# Patient Record
Sex: Female | Born: 1950
Health system: Southern US, Community
[De-identification: ages and names within clinical notes are randomized; demographics above are authoritative.]

---

## 2013-08-23 ENCOUNTER — Emergency Department (HOSPITAL_BASED_OUTPATIENT_CLINIC_OR_DEPARTMENT_OTHER): Payer: Worker's Compensation

## 2013-08-23 ENCOUNTER — Emergency Department (HOSPITAL_BASED_OUTPATIENT_CLINIC_OR_DEPARTMENT_OTHER)
Admission: EM | Admit: 2013-08-23 | Discharge: 2013-08-23 | Disposition: A | Discharge status: Home / Self Care | Payer: Worker's Compensation | Attending: Emergency Medicine | Admitting: Emergency Medicine

## 2013-08-23 ENCOUNTER — Encounter (HOSPITAL_BASED_OUTPATIENT_CLINIC_OR_DEPARTMENT_OTHER): Payer: Self-pay | Admitting: Emergency Medicine

## 2013-08-23 DIAGNOSIS — Y9289 Other specified places as the place of occurrence of the external cause: Secondary | ICD-10-CM | POA: Insufficient documentation

## 2013-08-23 DIAGNOSIS — S52502A Unspecified fracture of the lower end of left radius, initial encounter for closed fracture: Secondary | ICD-10-CM

## 2013-08-23 DIAGNOSIS — Y9389 Activity, other specified: Secondary | ICD-10-CM | POA: Insufficient documentation

## 2013-08-23 DIAGNOSIS — W230XXA Caught, crushed, jammed, or pinched between moving objects, initial encounter: Secondary | ICD-10-CM | POA: Insufficient documentation

## 2013-08-23 DIAGNOSIS — S52599A Other fractures of lower end of unspecified radius, initial encounter for closed fracture: Secondary | ICD-10-CM | POA: Insufficient documentation

## 2013-08-23 MED ORDER — OXYCODONE-ACETAMINOPHEN 5-325 MG PO TABS: 1.0000 | ORAL_TABLET | ORAL | Status: DC | PRN

## 2013-08-23 NOTE — ED Notes (Signed)
Patient transported to X-ray 

## 2013-08-23 NOTE — ED Notes (Signed)
Pt has injury to left wrist that occurred at work, wrist was stuck between two poles. CMS intact.

## 2013-08-23 NOTE — Discharge Instructions (Signed)
Wrist Fracture °A wrist fracture is a break in one of the bones of the wrist. Your wrist is made up of several small bones at the palm of your hand (carpal bones) and the two bones that make up your forearm (radius and ulna). The bones come together to form multiple large and small joints. The shape and design of these joints allow your wrist to bend and straighten, move side-to-side, and rotate, as in twisting your palm up or down. °CAUSES  °A fracture may occur in any of the bones in your wrist when enough force is applied to the wrist, such as when falling down onto an outstretched hand. Severe injuries may occur from a more forceful injury. °SYMPTOMS °Symptoms of wrist fractures include tenderness, bruising, and swelling. Additionally, the wrist may hang in an odd position or may be misshaped. °DIAGNOSIS °To diagnose a wrist fracture, your caregiver will physically examine your wrist. Your caregiver may also request an X-ray exam of your wrist. °TREATMENT °Treatment depends on many factors, including the nature and location of the fracture, your age, and your activity level. Treatment for wrist fracture can be nonsurgical or surgical. °For nonsurgical treatment, a plaster cast or splint may be applied to your wrist if the bone is in a good position (aligned). The cast will stay on for about 6 weeks. If the alignment of your bone is not good, it may be necessary to realign (reduce) it. After the bone is reduced, a splint usually is placed on your wrist to allow for a small amount of normal swelling. After about 1 week, the splint is removed and a cast is added. The cast is removed 2 or 3 weeks later, after the swelling goes down, causing the cast to loosen. Another cast is applied. This cast is removed after about another 2 or 3 weeks, for a total of 4 to 6 weeks of immobilization. °Sometimes the position of the bone is so far out of place that surgery is required to apply a device to hold it together as it  heals. If the bone cannot be reduced without cutting the skin around the bone (closed reduction), a cut (incision) is made to allow direct access to the bone to reduce it (open reduction). Depending on the fracture, there are a number of options for holding the bone in place while it heals, including a cast, metal pins, a plate and screws, and a device called an external fixator. With an external fixator, most of the hardware remains outside of the body. °HOME CARE INSTRUCTIONS °· To lessen swelling, keep your injured wrist elevated and move your fingers as much as possible. °· Apply ice to your wrist for the first 1 to 2 days after you have been treated or as directed by your caregiver. Applying ice helps to reduce inflammation and pain. °¨ Put ice in a plastic bag. °¨ Place a towel between your skin and the bag. °¨ Leave the ice on for 15 to 20 minutes at a time, every 2 hours while you are awake. °· Do not put pressure on any part of your cast or splint. It may break. °· Use a plastic bag to protect your cast or splint from water while bathing or showering. Do not lower your cast or splint into water. °· Only take over-the-counter or prescription medicines for pain as directed by your caregiver. °SEEK IMMEDIATE MEDICAL CARE IF:  °· Your cast or splint gets damaged or breaks. °· You have continued severe pain   or more swelling than you did before the cast was put on. °· Your skin or fingernails below the injury turn blue or gray or feel cold or numb. °· You develop decreased feeling in your fingers. °MAKE SURE YOU: °· Understand these instructions. °· Will watch your condition. °· Will get help right away if you are not doing well or get worse. °Document Released: 12/03/2004 Document Revised: 05/18/2011 Document Reviewed: 03/13/2011 °[employer]® Patient Information ©2015 [employer]. This information is not intended to replace advice given to you by your health care provider. Make sure you discuss any questions you  have with your health care provider. ° °

## 2013-08-23 NOTE — ED Provider Notes (Signed)
CSN: [id number]     Arrival date & time 08/23/13  1128 History   First MD Initiated Contact with Patient 08/23/13 1143     Chief Complaint  Patient presents with  . Wrist Injury     (Consider location/radiation/quality/duration/timing/severity/associated sxs/prior Treatment) HPI Comments: Patient presents to ER for evaluation of left wrist injury. Patient was moving an object and her wrist was caught between a pole and the object. Patient complains of pain at the wrist which worsens with movement.  Patient is a 63 y.o. female presenting with wrist injury.  Wrist Injury   History reviewed. No pertinent past medical history. History reviewed. No pertinent past surgical history. No family history on file. History  Substance Use Topics  . Smoking status: Never Smoker   . Smokeless tobacco: Not on file  . Alcohol Use: No   OB History   Grav Para Term Preterm Abortions TAB SAB Ect Mult Living                 Review of Systems  Musculoskeletal: Positive for arthralgias.      Allergies  Codeine  Home Medications   Prior to Admission medications   Medication Sig Start Date End Date Taking? Authorizing Provider  ALPRAZolam Prudy Feeler(XANAX) 0.5 MG tablet Take 0.5 mg by mouth at bedtime as needed for anxiety.   Yes Historical Provider, MD   BP 158/78  Pulse 99  Temp(Src) 98.4 F (36.9 C) (Oral)  Ht 5\' 1"  (1.549 m)  Wt 145 lb (65.772 kg)  BMI 27.41 kg/m2  SpO2 97% Physical Exam  Constitutional: She is oriented to person, place, and time. She appears well-developed and well-nourished.  Cardiovascular: Normal rate and regular rhythm.   Pulmonary/Chest: Effort normal and breath sounds normal.  Musculoskeletal:       Left wrist: She exhibits decreased range of motion, tenderness and swelling.  Neurological: She is alert and oriented to person, place, and time. She exhibits normal muscle tone.  Skin:       ED Course  Procedures (including critical care time) Labs Review Labs  Reviewed - No data to display  Imaging Review Dg Wrist Complete Left  08/23/2013   CLINICAL DATA:  Pain post trauma  EXAM: LEFT WRIST - COMPLETE 3+ VIEW  COMPARISON:  None.  FINDINGS: Frontal, oblique, lateral, and ulnar deviation scaphoid images were obtained. There is a transversely oriented fracture through the distal radial diaphysis. There is a second subtle lucency in the distal radial metaphysis - diaphysis junction. No other fractures. No dislocation. Joint spaces appear intact. There is a small focus of calcification adjacent to the ulnar styloid which may represent residua of old trauma or possibly a small area of unfused apophysis.  IMPRESSION: Nondisplaced fractures of the distal radius. No dislocation. Small focus of calcification adjacent to the ulnar styloid consistent with either residua of old trauma or nonfusion of small apophysis.   Electronically Signed   By: Bretta BangWilliam  Woodruff M.D.   On: 08/23/2013 12:11     EKG Interpretation None      MDM   Final diagnoses:  None  Distal Radius Fracture   Presents to the ER for evaluation of isolated wrist injury. Patient had a crushing type of motion to the distal wrist region. X-ray shows nondisplaced linear transverse fracture. She was placed in a sugar tong splint and sling, will be provided analgesia.  [place] in [place] that she will call for followup. This was a workers comp incident, so  she was informed that she needs to talk to her job and see if they have a specialist that she needs to see. As a final resort, was also given contact information for Doctor workman, on-call for hand surgery, in the event that she can find no one else to followup with.    Gilda Creasehristopher J. Pollina, MD 08/23/13 [phone]

## 2015-05-01 ENCOUNTER — Encounter (HOSPITAL_BASED_OUTPATIENT_CLINIC_OR_DEPARTMENT_OTHER): Payer: Self-pay | Admitting: *Deleted

## 2015-05-01 ENCOUNTER — Emergency Department (HOSPITAL_BASED_OUTPATIENT_CLINIC_OR_DEPARTMENT_OTHER): Payer: 59

## 2015-05-01 ENCOUNTER — Emergency Department (HOSPITAL_BASED_OUTPATIENT_CLINIC_OR_DEPARTMENT_OTHER)
Admission: EM | Admit: 2015-05-01 | Discharge: 2015-05-01 | Disposition: A | Discharge status: Home / Self Care | Payer: 59 | Attending: Emergency Medicine | Admitting: Emergency Medicine

## 2015-05-01 DIAGNOSIS — S52502A Unspecified fracture of the lower end of left radius, initial encounter for closed fracture: Secondary | ICD-10-CM | POA: Insufficient documentation

## 2015-05-01 DIAGNOSIS — W010XXA Fall on same level from slipping, tripping and stumbling without subsequent striking against object, initial encounter: Secondary | ICD-10-CM | POA: Insufficient documentation

## 2015-05-01 DIAGNOSIS — S6992XA Unspecified injury of left wrist, hand and finger(s), initial encounter: Secondary | ICD-10-CM | POA: Diagnosis present

## 2015-05-01 DIAGNOSIS — Y9389 Activity, other specified: Secondary | ICD-10-CM | POA: Insufficient documentation

## 2015-05-01 DIAGNOSIS — Y92002 Bathroom of unspecified non-institutional (private) residence single-family (private) house as the place of occurrence of the external cause: Secondary | ICD-10-CM | POA: Diagnosis not present

## 2015-05-01 DIAGNOSIS — Y998 Other external cause status: Secondary | ICD-10-CM | POA: Insufficient documentation

## 2015-05-01 MED ORDER — FENTANYL CITRATE (PF) 100 MCG/2ML IJ SOLN
100.0000 ug | Freq: Once | INTRAMUSCULAR | Status: AC
  Administered 2015-05-01: 100 ug via INTRAVENOUS
  Filled 2015-05-01: qty 2

## 2015-05-01 MED ORDER — OXYCODONE-ACETAMINOPHEN 5-325 MG PO TABS: 1.0000 | ORAL_TABLET | ORAL | Status: AC | PRN

## 2015-05-01 MED ORDER — OXYCODONE-ACETAMINOPHEN 5-325 MG PO TABS: 2.0000 | ORAL_TABLET | Freq: Once | ORAL | Status: DC

## 2015-05-01 MED ORDER — FENTANYL CITRATE (PF) 100 MCG/2ML IJ SOLN
50.0000 ug | Freq: Once | INTRAMUSCULAR | Status: AC
  Administered 2015-05-01: 50 ug via INTRAVENOUS
  Filled 2015-05-01: qty 2

## 2015-05-01 MED FILL — OXYCODONE/APAP 5-325: 5-325 | 2 days supply | Qty: 20 | Fill #0

## 2015-05-01 NOTE — ED Provider Notes (Signed)
CSN: [id number]     Arrival date & time 05/01/15  0865 History   First MD Initiated Contact with Patient 05/01/15 0801     No chief complaint on file.    (Consider location/radiation/quality/duration/timing/severity/associated sxs/prior Treatment) HPI  65 year old female presents with a left wrist injury after a fall. She tripped and fell and landed on a dresser. Feels some tingling in her fingers but is able to move her fingers. Broke the same wrist but did not require surgery 2 years ago. Did not hit her head or lose consciousness. No other injuries.  History reviewed. No pertinent past medical history. History reviewed. No pertinent past surgical history. No family history on file. Social History  Substance Use Topics  . Smoking status: Never Smoker   . Smokeless tobacco: None  . Alcohol Use: No   OB History    No data available     Review of Systems  Musculoskeletal: Positive for joint swelling and arthralgias.  Skin: Negative for wound.  Neurological: Positive for numbness. Negative for weakness.      Allergies  Codeine  Home Medications   Prior to Admission medications   Medication Sig Start Date End Date Taking? Authorizing Provider  ALPRAZolam Prudy Feeler) 0.5 MG tablet Take 0.5 mg by mouth at bedtime as needed for anxiety.   Yes Historical Provider, MD   BP 154/91 mmHg  Pulse 73  Temp(Src) 98.4 F (36.9 C) (Oral)  Resp 18  Ht  (1.549 m)  Wt 145 lb (65.772 kg)  BMI 27.41 kg/m2  SpO2 100% Physical Exam  Constitutional: She is oriented to person, place, and time. She appears well-developed and well-nourished.  HENT:  Head: Normocephalic and atraumatic.  Right Ear: External ear normal.  Left Ear: External ear normal.  Nose: Nose normal.  Eyes: Right eye exhibits no discharge. Left eye exhibits no discharge.  Cardiovascular: Normal rate and regular rhythm.   Pulses:      Radial pulses are 2+ on the left side.  Pulmonary/Chest: Effort normal.   Abdominal: She exhibits no distension.  Musculoskeletal:       Left elbow: She exhibits normal range of motion. No tenderness found.       Left wrist: She exhibits decreased range of motion, tenderness, bony tenderness, swelling and deformity (dorsally). She exhibits no laceration.       Left forearm: She exhibits no tenderness.  Normal capillary refill in fingers. Able to move fingers and has normal sensation  Neurological: She is alert and oriented to person, place, and time.  Skin: Skin is warm and dry.  Nursing note and vitals reviewed.   ED Course  Procedures (including critical care time) Labs Review Labs Reviewed - No data to display  Imaging Review Dg Wrist Complete Left  05/01/2015  CLINICAL DATA:  Pain following fall EXAM: LEFT WRIST - COMPLETE 3+ VIEW COMPARISON:  August 23, 2013. FINDINGS: Frontal, oblique, and lateral views were obtained. There is a comminuted fracture of the distal radial metaphysis with dorsal displacement angulation distally. There is approximately 1.5 cm of overriding of fracture fragments. There is a subtle avulsion the ulnar styloid. No other fractures are evident. No dislocation. The joint spaces appear overall unremarkable. IMPRESSION: Comminuted fracture distal radial metaphysis with dorsal angulation and displacement of distal major fracture fragment with respect to the major proximal fragment. Approximately 1.5 cm of overriding of fracture fragments noted in this region. Subtle avulsion ulnar styloid. No dislocation. Electronically Signed   By: Bretta Bang III M.D.  On: 05/01/2015 08:16   I have personally reviewed and evaluated these images and lab results as part of my medical decision-making.   EKG Interpretation None      MDM   Final diagnoses:  Fracture of left distal radius, closed, initial encounter    Patient with a distal radius fracture, as above. She is neurovascularly intact. She does have some tingling sensation but normal  gross sensation and strong radial pulse. Normal capillary refill. She has seen Dr. Amanda Pea in the past and requests to see him again. I discussed with his office through him and they want patient to be sent over to his office for a 10 AM appointment. Patient to be splinted here but no other manipulation per them. Ring has been removed by family. Patient instructed to drive straight over to his office.    Pricilla Loveless, MD 05/01/15 [phone]

## 2015-05-01 NOTE — ED Notes (Signed)
C/o tripped and fell in bathroom onto drawer and vinyl floor. Has deformity to left wrist. Feels some numbness to fingers. NL cap refill and nl pulses. No other injury, and no LOC.

## 2015-05-01 NOTE — ED Notes (Signed)
Pt given an ice pack  °

## 2015-09-25 IMAGING — CR DG WRIST COMPLETE 3+V*L*
4 series · 4 of 4 positions shown · non-contrast
Comparison: None.

CLINICAL DATA: Pain post trauma

EXAM:
LEFT WRIST - COMPLETE 3+ VIEW

[x wrist pa left]
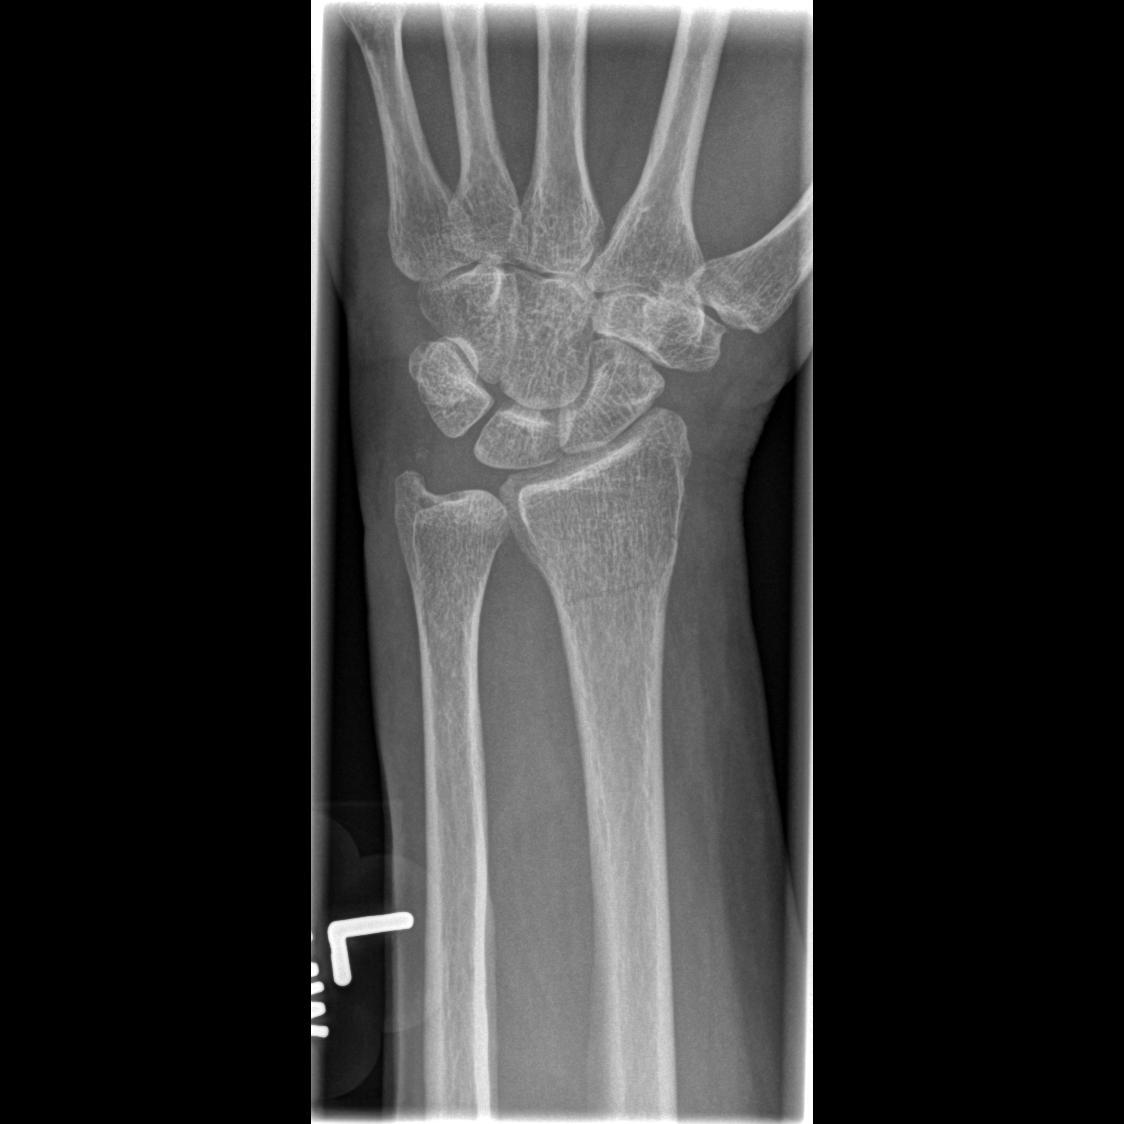

[x wrist obl left]
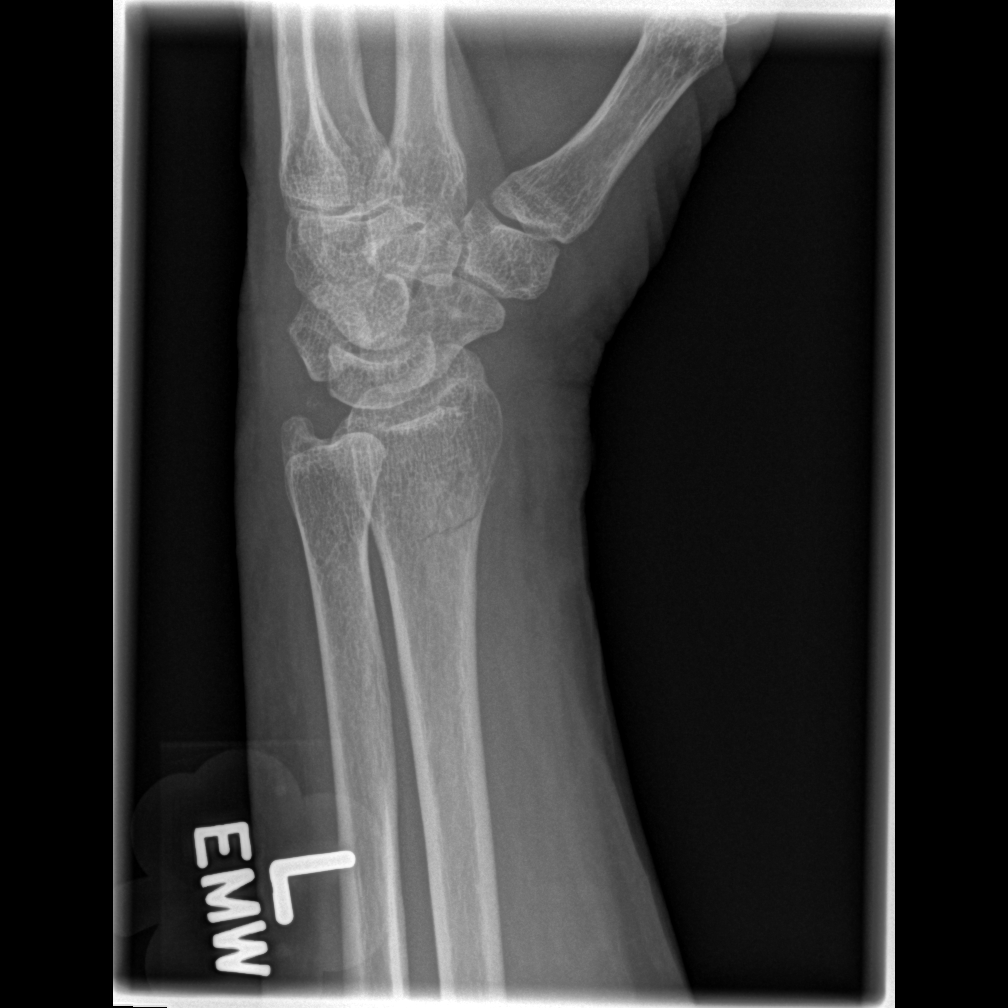

[x wrist lat left]
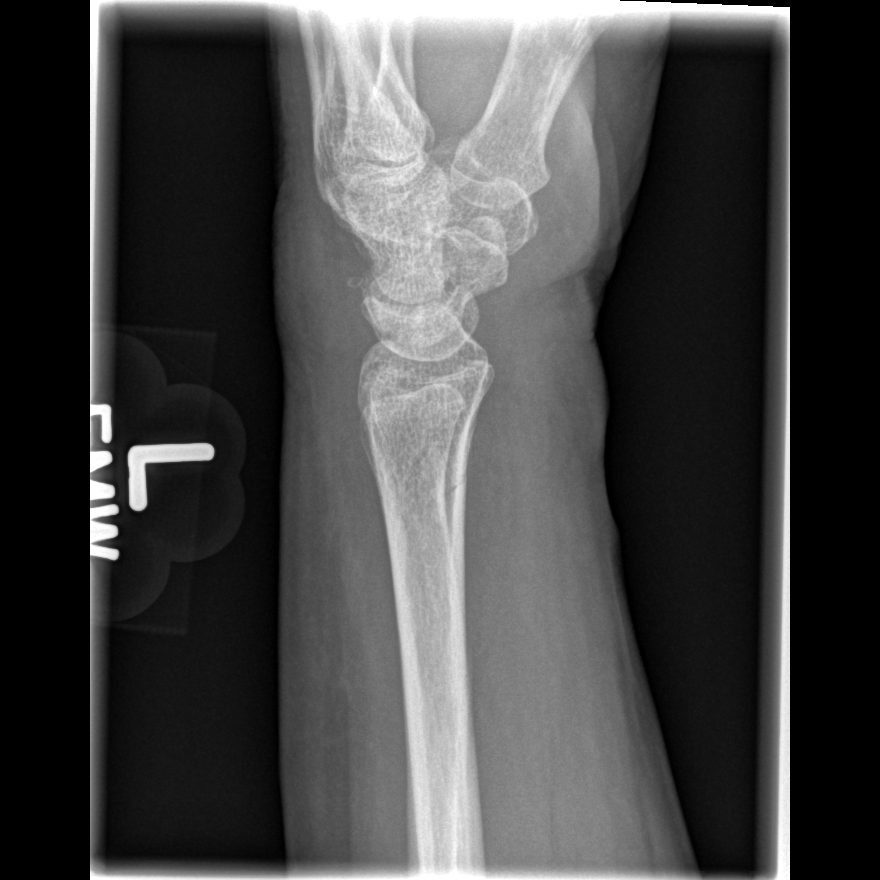

[x navicular]
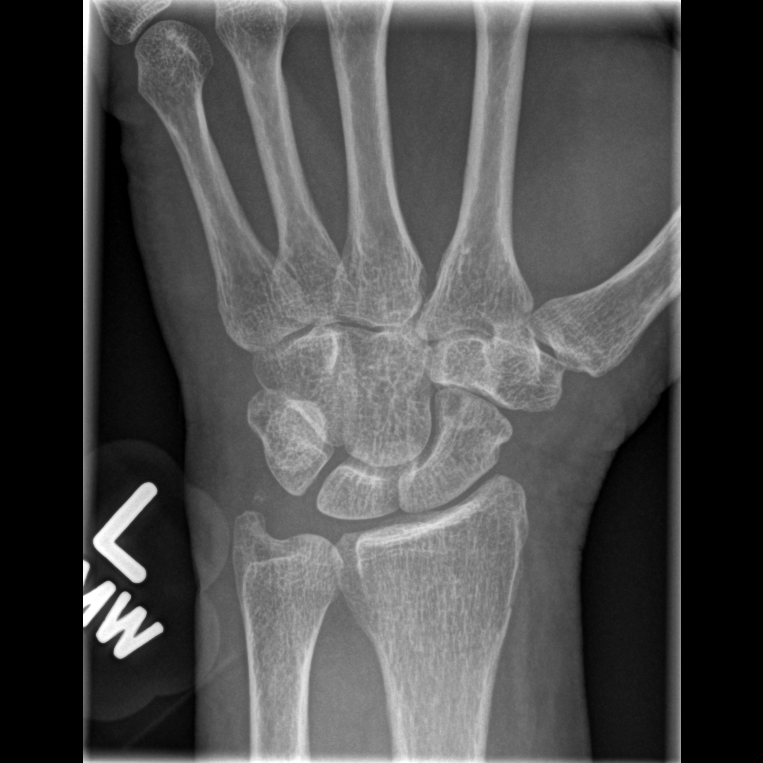

[4 of 4 positions shown; findings below may reference images not displayed]

FINDINGS: Frontal, oblique, lateral, and ulnar deviation scaphoid images were
obtained. There is a transversely oriented fracture through the
distal radial diaphysis. There is a second subtle lucency in the
distal radial metaphysis - diaphysis junction. No other fractures.
No dislocation. Joint spaces appear intact. There is a small focus
of calcification adjacent to the ulnar styloid which may represent
residua of old trauma or possibly a small area of unfused apophysis.
IMPRESSION: Nondisplaced fractures of the distal radius. No dislocation. Small
focus of calcification adjacent to the ulnar styloid consistent with
either residua of old trauma or nonfusion of small apophysis.

## 2017-06-02 IMAGING — DX DG WRIST COMPLETE 3+V*L*
3 series · 3 of 3 positions shown · non-contrast
Comparison: August 23, 2013.

CLINICAL DATA: Pain following fall

EXAM:
LEFT WRIST - COMPLETE 3+ VIEW

[wrist pa]
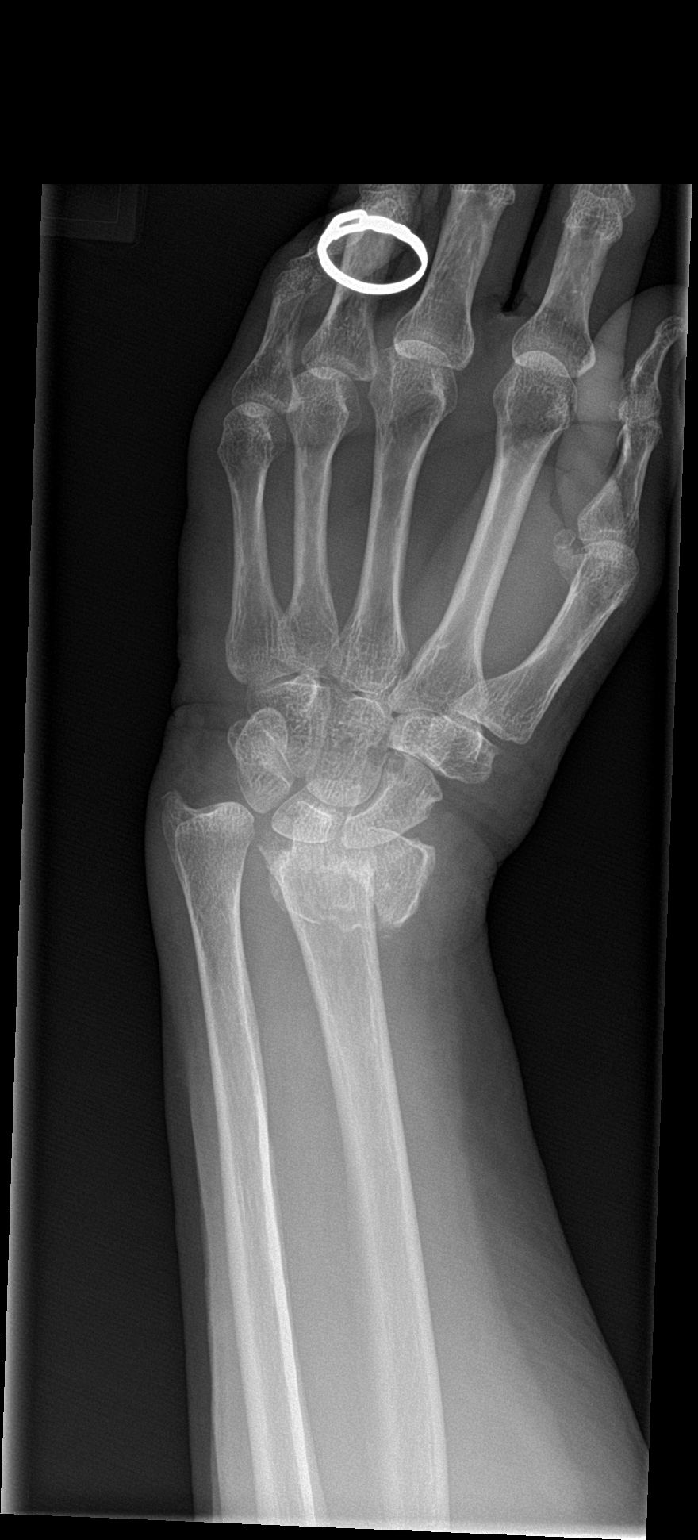

[wrist obl]
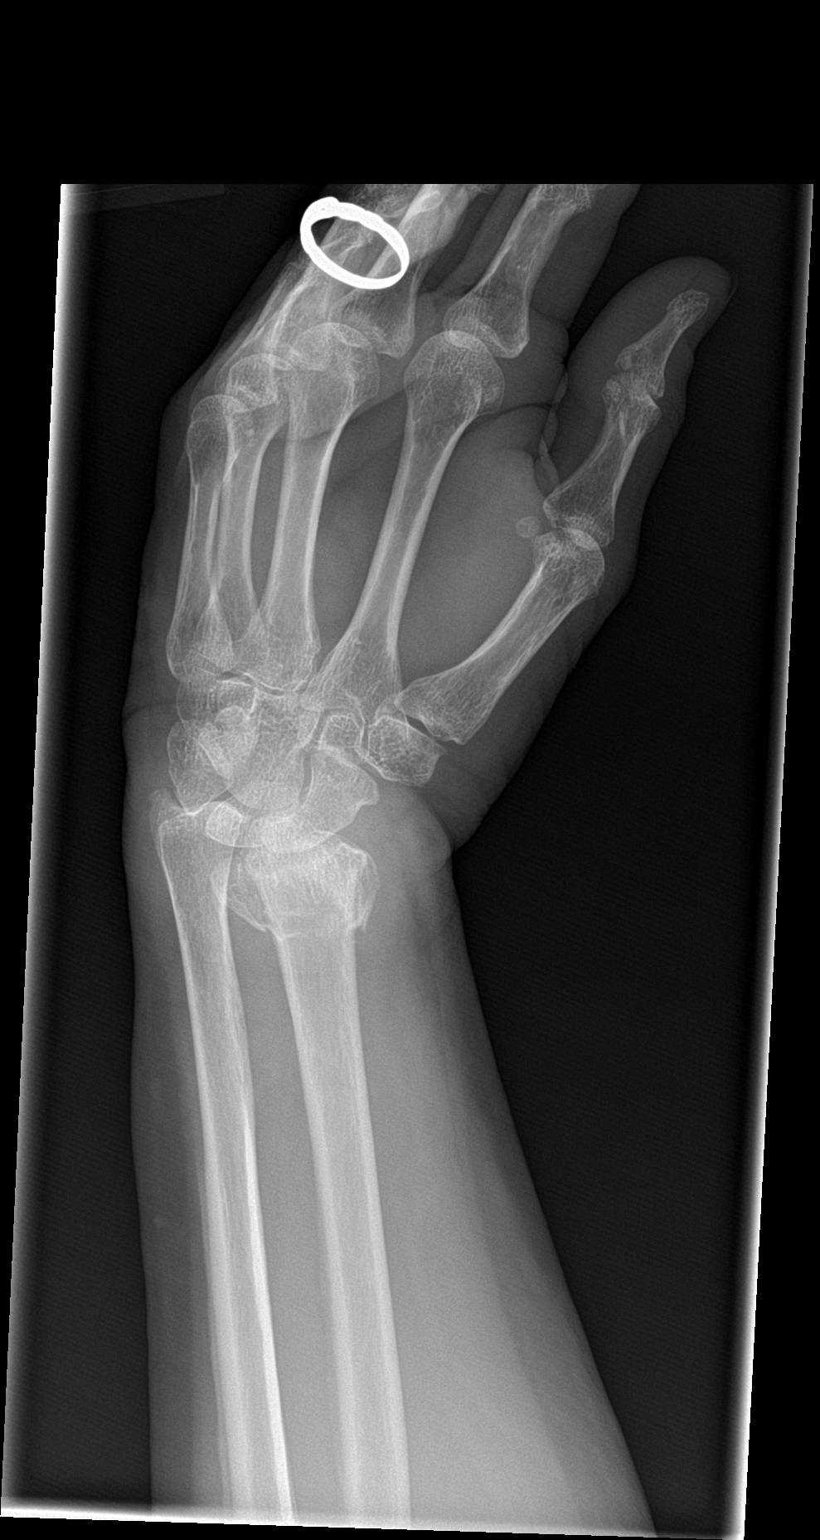

[wrist lat]
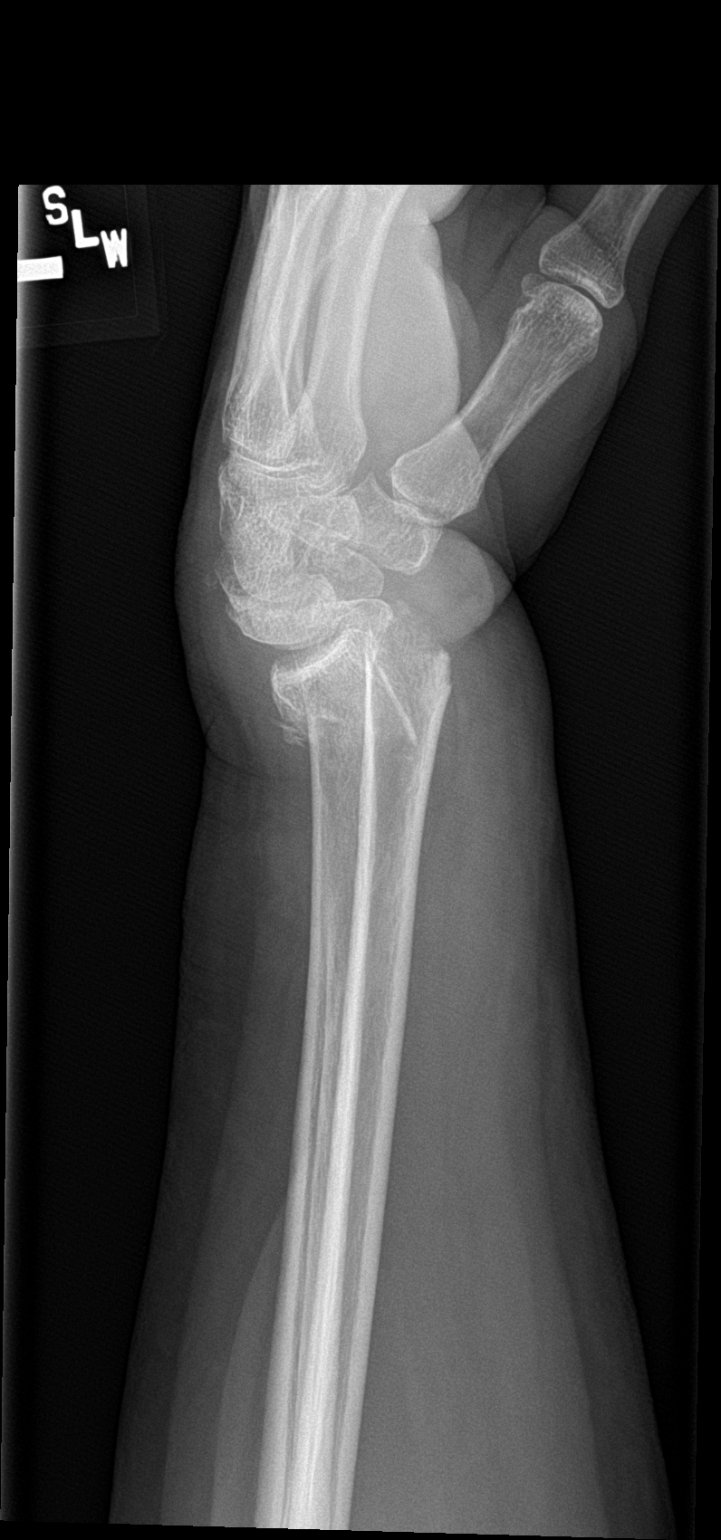

[3 of 3 positions shown; findings below may reference images not displayed]

FINDINGS: Frontal, oblique, and lateral views were obtained. There is a
comminuted fracture of the distal radial metaphysis with dorsal
displacement angulation distally. There is approximately 1.5 cm of
overriding of fracture fragments. There is a subtle avulsion the
ulnar styloid. No other fractures are evident. No dislocation. The
joint spaces appear overall unremarkable.
IMPRESSION: Comminuted fracture distal radial metaphysis with dorsal angulation
and displacement of distal major fracture fragment with respect to
the major proximal fragment. Approximately 1.5 cm of overriding of
fracture fragments noted in this region. Subtle avulsion ulnar
styloid. No dislocation.
